# Patient Record
Sex: Female | Born: 1980 | Race: White | Hispanic: No | Smoking: Never smoker
Health system: Southern US, Community
[De-identification: ages and names within clinical notes are randomized; demographics above are authoritative.]

## PROBLEM LIST (undated history)

## (undated) DIAGNOSIS — M722 Plantar fascial fibromatosis: Secondary | ICD-10-CM

## (undated) DIAGNOSIS — G629 Polyneuropathy, unspecified: Secondary | ICD-10-CM

## (undated) HISTORY — PX: FOOT SURGERY: SHX648

---

## 2006-09-30 ENCOUNTER — Ambulatory Visit (HOSPITAL_COMMUNITY): Admission: RE | Admit: 2006-09-30 | Discharge: 2006-09-30 | Payer: Self-pay

## 2006-09-30 ENCOUNTER — Emergency Department (HOSPITAL_COMMUNITY): Admission: EM | Admit: 2006-09-30 | Discharge: 2006-09-30 | Payer: Self-pay | Admitting: Emergency Medicine

## 2006-10-01 ENCOUNTER — Encounter (INDEPENDENT_AMBULATORY_CARE_PROVIDER_SITE_OTHER): Payer: Self-pay | Admitting: *Deleted

## 2006-10-01 ENCOUNTER — Ambulatory Visit (HOSPITAL_COMMUNITY): Admission: AD | Admit: 2006-10-01 | Discharge: 2006-10-02 | Payer: Self-pay | Admitting: *Deleted

## 2006-12-06 ENCOUNTER — Encounter: Admission: RE | Admit: 2006-12-06 | Discharge: 2006-12-06 | Payer: Self-pay | Admitting: Occupational Medicine

## 2010-08-21 ENCOUNTER — Ambulatory Visit
Admission: RE | Admit: 2010-08-21 | Discharge: 2010-08-21 | Disposition: A | Discharge status: Home / Self Care | Payer: BC Managed Care – PPO | Source: Ambulatory Visit | Attending: Family Medicine | Admitting: Family Medicine

## 2010-08-21 ENCOUNTER — Other Ambulatory Visit: Payer: Self-pay | Admitting: Family Medicine

## 2010-08-21 DIAGNOSIS — R52 Pain, unspecified: Secondary | ICD-10-CM

## 2010-08-21 DIAGNOSIS — R609 Edema, unspecified: Secondary | ICD-10-CM

## 2010-11-24 NOTE — H&P (Signed)
NAME:  Sabrina Medina, Sabrina Medina               ACCOUNT NO.:  [id number]   MEDICAL RECORD NO.:  [id number]          PATIENT TYPE:  EMS   LOCATION:  ED                           FACILITY:  [facility]   PHYSICIAN:  Alfonse Ras, MD   DATE OF BIRTH:  [birth year]   DATE OF ADMISSION:  09/30/2006  DATE OF DISCHARGE:                              HISTORY & PHYSICAL   HISTORY OF PRESENT ILLNESS:  The patient is a 30 year old, white female  who presents with a 6-hour history of right lower quadrant abdominal  pain which is actually improved and almost resolved by the time she  presents to the ER.  The patient was ordered a CAT scan which shows an  enlarged appendix with no parous appendiceal stranding or inflammatory  changes.  There is no evidence of fecalith.  She does have a very small,  subcentimeter, hemorrhagic cyst on the left ovary.  She was afebrile at  the [facility] and remains afebrile here.   PAST MEDICAL HISTORY:  None.   PAST SURGICAL HISTORY:  Significant only for a C-section.   MEDICATIONS:  She takes no medications.   PAST GYNECOLOGICAL HISTORY:  Last menstrual period was September 02, 2006, which she started early 1 month ago and thought that these were  menstrual cramps this morning.   PHYSICAL EXAMINATION:  VITAL SIGNS:  Temperature is 98, blood pressure  is 150/72, heart rate is 79, respiratory rate is 20.  HEENT:  Exam is benign.  Normocephalic, atraumatic.  Pupils are equal,  round, reactive to light.  NECK:  Supple and soft without thyromegaly or cervical adenopathy.  LUNGS:  Clear to auscultation and percussion x2.  HEART:  Regular rate and rhythm without murmur, rubs or gallops.  ABDOMEN:  Soft, moderately obese, slightly tender with deep palpation in  the right lower quadrant without rebound.  She has no costovertebral  angle tenderness.  EXTREMITIES:  Show no clubbing, cyanosis or edema.   LABORATORY DATA AND X-RAY FINDINGS:  CT scan shows a slightly  enlarged,  9 mm appendix without any inflammatory changes.   IMPRESSION/PLAN:  Possible early acute appendicitis, however, clinically  markedly improved.  I gave her the option of laparoscopic exploration  versus discharging home and coming back for repeat CT scan in the  morning.  I will discuss this with Dr. Katrinka Blazing.  My plan is to discharge  her for followup CT scan tomorrow.  Hopefully, that report  will be called to me per Dr. Katrinka Blazing when I am in the office in the  morning.  Instructions are given to the patient.  I did not give the  patient any narcotics and I have asked her to return to the emergency  room with worsening pain, nausea or vomiting of which she denies at this  time.      Alfonse Ras, MD  Electronically Signed     KRE/MEDQ  D:  09/30/2006  T:  10/01/2006  Job:  [id number]   cc:   [facility]/Dr. Katrinka Blazing

## 2010-11-24 NOTE — Op Note (Signed)
[name], Medina               ACCOUNT NO.:  [id number]   MEDICAL RECORD NO.:  [id number]          PATIENT TYPE:  OIB   LOCATION:  0098                         FACILITY:  [facility]   PHYSICIAN:  Anselm Pancoast. Weatherly, M.D.DATE OF BIRTH:  [birth year]   DATE OF PROCEDURE:  10/01/2006  DATE OF DISCHARGE:                               OPERATIVE REPORT   PREOPERATIVE DIAGNOSIS:  Acute appendicitis, diabetes mellitus.   POSTOPERATIVE DIAGNOSIS:  Acute appendicitis, diabetes mellitus.   OPERATION:  Laparoscopic appendectomy.   ANESTHESIA:  General.   SURGEON:  Anselm Pancoast. Zachery Dakins, M.D.   ASSISTANT:  Nurse.   HISTORY:  Sabrina Medina is a 30 year old female who presented to the ER  yesterday, was seen last evening after a CAT scan by Dr. Colin Benton.  The  patient states Dr. Colin Benton advised her to go home but to keep in touch.  I  think the appendix had been enlarged and was thought to be an early  appendicitis on the reading of the CT.  The patient returned this  morning and had a follow up CT that showed a more inflamed appendix with  a surrounding streak and Dr. Colin Benton was not able to get her to the OR  schedule today because of scheduling conflicts, and asked me to manage  the patient.  At approximately 4:30, OR time was available and she was  given 3 grams Unasyn and taken to the operating suite.   DESCRIPTION OF PROCEDURE:  The abdomen was prepped with Betadine  solution.  A Foley catheter had been inserted sterilely.  She had PAS  stockings.  A small incision was made below the umbilicus, the fascia  was identified, and then we carefully entered into the peritoneal  cavity.  A purse-string suture of 0 Vicryl was placed and the Hasson  cannula introduced.  The patient did have fluid in the lower right  abdomen and a 5 mm port was placed in the right upper quadrant and a  10/11 in the left lower quadrant, and I could see the acutely inflamed  appendix right under the cecum and could grasp  it with a grasper.  The  mesentery was first divided with the Harmonic scalpel and freed right on  down to the base of the appendix and then the appendix was removed,  transected with a vascular GIA laparoscopic stapler.  The appendix was  acutely inflamed and placed in an endocatch bag.  The bag containing the  appendix was withdrawn.  The Hassan cannula was reinserted, the CO2 tank  was empty at this point and there was about a 5 minute delay as we were  getting a new tank hooked up, but there was no evidence of any bleeding  and the site where the appendix had been removed was good hemostasis and  we looked at the areas where the mesentery had been divided and it was  nice and dry.  Next, the 10/11 port in the left lower quadrant was  removed, this is where I had kept the camera, and I was then using the  camera at the umbilicus.  Next, the 5 mm port was withdrawn under direct  view and then the [facility] cannula was withdrawn.  I put a figure-of-eight  of 0 Vicryl in addition to the purse-  string, tied them both, and anesthetized the fascia.  The subcutaneous  wounds were closed with 4-0 Vicryl, Benzoin and Steri-Strips on the  skin.  We will check a glucose in the recovery room and I will give her  one additional dose of antibiotic and, hopefully, she will be ready for  discharge in the a.m.           ______________________________  Anselm Pancoast. Zachery Dakins, M.D.     WJW/MEDQ  D:  10/01/2006  T:  10/01/2006  Job:  [id number]

## 2011-06-16 ENCOUNTER — Encounter: Payer: Self-pay | Admitting: Nurse Practitioner

## 2011-06-16 ENCOUNTER — Emergency Department (HOSPITAL_COMMUNITY)
Admission: EM | Admit: 2011-06-16 | Discharge: 2011-06-16 | Disposition: A | Discharge status: Home / Self Care | Payer: No Typology Code available for payment source | Attending: Emergency Medicine | Admitting: Emergency Medicine

## 2011-06-16 ENCOUNTER — Emergency Department (HOSPITAL_COMMUNITY): Payer: No Typology Code available for payment source

## 2011-06-16 DIAGNOSIS — S8002XA Contusion of left knee, initial encounter: Secondary | ICD-10-CM

## 2011-06-16 DIAGNOSIS — E119 Type 2 diabetes mellitus without complications: Secondary | ICD-10-CM | POA: Insufficient documentation

## 2011-06-16 DIAGNOSIS — M25569 Pain in unspecified knee: Secondary | ICD-10-CM | POA: Insufficient documentation

## 2011-06-16 DIAGNOSIS — S8000XA Contusion of unspecified knee, initial encounter: Secondary | ICD-10-CM | POA: Insufficient documentation

## 2011-06-16 DIAGNOSIS — T148XXA Other injury of unspecified body region, initial encounter: Secondary | ICD-10-CM

## 2011-06-16 HISTORY — DX: Polyneuropathy, unspecified: G62.9

## 2011-06-16 HISTORY — DX: Plantar fascial fibromatosis: M72.2

## 2011-06-16 MED ORDER — ACETAMINOPHEN 325 MG PO TABS
650.0000 mg | ORAL_TABLET | Freq: Once | ORAL | Status: AC
  Administered 2011-06-16: 650 mg via ORAL
  Filled 2011-06-16: qty 2

## 2011-06-16 MED ORDER — IBUPROFEN 600 MG PO TABS: 600.0000 mg | ORAL_TABLET | Freq: Four times a day (QID) | ORAL | Status: AC | PRN

## 2011-06-16 MED ORDER — HYDROCODONE-ACETAMINOPHEN 5-500 MG PO TABS: 1.0000 | ORAL_TABLET | Freq: Four times a day (QID) | ORAL | Status: AC | PRN

## 2011-06-16 MED ORDER — ACETAMINOPHEN 325 MG PO TABS
ORAL_TABLET | ORAL | Status: AC
  Filled 2011-06-16: qty 1

## 2011-06-16 MED ORDER — IBUPROFEN 200 MG PO TABS
600.0000 mg | ORAL_TABLET | Freq: Once | ORAL | Status: AC
  Administered 2011-06-16: 600 mg via ORAL
  Filled 2011-06-16: qty 3

## 2011-06-16 MED ORDER — HYDROCODONE-ACETAMINOPHEN 5-325 MG PO TABS
1.0000 | ORAL_TABLET | Freq: Once | ORAL | Status: AC
  Administered 2011-06-16: 1 via ORAL
  Filled 2011-06-16: qty 1
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
## 2011-07-19 ENCOUNTER — Ambulatory Visit: Payer: BC Managed Care – PPO | Attending: Family Medicine | Admitting: Physical Therapy

## 2011-07-19 DIAGNOSIS — M542 Cervicalgia: Secondary | ICD-10-CM | POA: Insufficient documentation

## 2011-07-19 DIAGNOSIS — IMO0001 Reserved for inherently not codable concepts without codable children: Secondary | ICD-10-CM | POA: Insufficient documentation

## 2011-07-24 ENCOUNTER — Ambulatory Visit: Payer: BC Managed Care – PPO | Admitting: Physical Therapy

## 2011-07-27 ENCOUNTER — Ambulatory Visit: Payer: BC Managed Care – PPO | Admitting: Physical Therapy

## 2011-07-31 ENCOUNTER — Ambulatory Visit: Payer: BC Managed Care – PPO | Admitting: Physical Therapy

## 2011-08-02 ENCOUNTER — Encounter: Payer: Self-pay | Admitting: Physical Therapy

## 2011-08-03 ENCOUNTER — Encounter: Payer: Self-pay | Admitting: Physical Therapy

## 2011-08-07 ENCOUNTER — Ambulatory Visit: Payer: BC Managed Care – PPO | Admitting: Physical Therapy

## 2011-08-09 ENCOUNTER — Encounter: Payer: Self-pay | Admitting: Physical Therapy

## 2011-08-14 ENCOUNTER — Encounter: Payer: Self-pay | Admitting: Physical Therapy

## 2011-08-16 ENCOUNTER — Encounter: Payer: Self-pay | Admitting: Physical Therapy

## 2011-08-21 ENCOUNTER — Encounter: Payer: Self-pay | Admitting: Physical Therapy

## 2011-08-23 ENCOUNTER — Encounter: Payer: Self-pay | Admitting: Physical Therapy

## 2012-12-19 ENCOUNTER — Ambulatory Visit
Admission: RE | Admit: 2012-12-19 | Discharge: 2012-12-19 | Disposition: A | Discharge status: Home / Self Care | Payer: Managed Care, Other (non HMO) | Source: Ambulatory Visit | Attending: Family Medicine | Admitting: Family Medicine

## 2012-12-19 ENCOUNTER — Other Ambulatory Visit: Payer: Self-pay | Admitting: Family Medicine

## 2012-12-19 DIAGNOSIS — R109 Unspecified abdominal pain: Secondary | ICD-10-CM
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
## 2014-12-07 ENCOUNTER — Ambulatory Visit: Payer: Self-pay | Admitting: *Deleted

## 2014-12-30 ENCOUNTER — Other Ambulatory Visit: Payer: Self-pay | Admitting: Advanced Practice Midwife

## 2015-10-17 DIAGNOSIS — Z794 Long term (current) use of insulin: Secondary | ICD-10-CM | POA: Diagnosis not present

## 2015-10-17 DIAGNOSIS — E103399 Type 1 diabetes mellitus with moderate nonproliferative diabetic retinopathy without macular edema, unspecified eye: Secondary | ICD-10-CM | POA: Diagnosis not present

## 2015-11-01 DIAGNOSIS — E103393 Type 1 diabetes mellitus with moderate nonproliferative diabetic retinopathy without macular edema, bilateral: Secondary | ICD-10-CM | POA: Diagnosis not present

## 2015-11-02 DIAGNOSIS — Z794 Long term (current) use of insulin: Secondary | ICD-10-CM | POA: Diagnosis not present

## 2015-11-02 DIAGNOSIS — E109 Type 1 diabetes mellitus without complications: Secondary | ICD-10-CM | POA: Diagnosis not present

## 2015-12-06 DIAGNOSIS — Z794 Long term (current) use of insulin: Secondary | ICD-10-CM | POA: Diagnosis not present

## 2015-12-06 DIAGNOSIS — E109 Type 1 diabetes mellitus without complications: Secondary | ICD-10-CM | POA: Diagnosis not present

## 2016-01-02 DIAGNOSIS — E10319 Type 1 diabetes mellitus with unspecified diabetic retinopathy without macular edema: Secondary | ICD-10-CM | POA: Diagnosis not present

## 2016-01-02 DIAGNOSIS — E103393 Type 1 diabetes mellitus with moderate nonproliferative diabetic retinopathy without macular edema, bilateral: Secondary | ICD-10-CM | POA: Diagnosis not present

## 2016-01-05 DIAGNOSIS — E109 Type 1 diabetes mellitus without complications: Secondary | ICD-10-CM | POA: Diagnosis not present

## 2016-01-05 DIAGNOSIS — Z794 Long term (current) use of insulin: Secondary | ICD-10-CM | POA: Diagnosis not present

## 2016-02-03 DIAGNOSIS — E109 Type 1 diabetes mellitus without complications: Secondary | ICD-10-CM | POA: Diagnosis not present

## 2016-02-03 DIAGNOSIS — Z794 Long term (current) use of insulin: Secondary | ICD-10-CM | POA: Diagnosis not present

## 2016-02-24 DIAGNOSIS — E109 Type 1 diabetes mellitus without complications: Secondary | ICD-10-CM | POA: Diagnosis not present

## 2016-02-24 DIAGNOSIS — Z794 Long term (current) use of insulin: Secondary | ICD-10-CM | POA: Diagnosis not present

## 2016-03-13 DIAGNOSIS — Z8619 Personal history of other infectious and parasitic diseases: Secondary | ICD-10-CM | POA: Diagnosis not present

## 2016-03-27 DIAGNOSIS — R5383 Other fatigue: Secondary | ICD-10-CM | POA: Diagnosis not present

## 2016-03-27 DIAGNOSIS — N943 Premenstrual tension syndrome: Secondary | ICD-10-CM | POA: Diagnosis not present

## 2016-03-30 DIAGNOSIS — E109 Type 1 diabetes mellitus without complications: Secondary | ICD-10-CM | POA: Diagnosis not present

## 2016-03-30 DIAGNOSIS — Z794 Long term (current) use of insulin: Secondary | ICD-10-CM | POA: Diagnosis not present

## 2016-04-17 DIAGNOSIS — Z794 Long term (current) use of insulin: Secondary | ICD-10-CM | POA: Diagnosis not present

## 2016-04-17 DIAGNOSIS — E103393 Type 1 diabetes mellitus with moderate nonproliferative diabetic retinopathy without macular edema, bilateral: Secondary | ICD-10-CM | POA: Diagnosis not present

## 2016-04-26 DIAGNOSIS — Z794 Long term (current) use of insulin: Secondary | ICD-10-CM | POA: Diagnosis not present

## 2016-04-26 DIAGNOSIS — E103392 Type 1 diabetes mellitus with moderate nonproliferative diabetic retinopathy without macular edema, left eye: Secondary | ICD-10-CM | POA: Diagnosis not present

## 2016-04-27 DIAGNOSIS — E109 Type 1 diabetes mellitus without complications: Secondary | ICD-10-CM | POA: Diagnosis not present

## 2016-04-27 DIAGNOSIS — Z794 Long term (current) use of insulin: Secondary | ICD-10-CM | POA: Diagnosis not present

## 2016-05-01 DIAGNOSIS — H1045 Other chronic allergic conjunctivitis: Secondary | ICD-10-CM | POA: Diagnosis not present

## 2016-05-22 DIAGNOSIS — E109 Type 1 diabetes mellitus without complications: Secondary | ICD-10-CM | POA: Diagnosis not present

## 2016-05-22 DIAGNOSIS — Z794 Long term (current) use of insulin: Secondary | ICD-10-CM | POA: Diagnosis not present

## 2016-06-26 DIAGNOSIS — E109 Type 1 diabetes mellitus without complications: Secondary | ICD-10-CM | POA: Diagnosis not present

## 2016-06-26 DIAGNOSIS — Z794 Long term (current) use of insulin: Secondary | ICD-10-CM | POA: Diagnosis not present

## 2016-07-08 DIAGNOSIS — J101 Influenza due to other identified influenza virus with other respiratory manifestations: Secondary | ICD-10-CM | POA: Diagnosis not present

## 2016-07-30 DIAGNOSIS — Z794 Long term (current) use of insulin: Secondary | ICD-10-CM | POA: Diagnosis not present

## 2016-07-30 DIAGNOSIS — E109 Type 1 diabetes mellitus without complications: Secondary | ICD-10-CM | POA: Diagnosis not present

## 2016-08-23 DIAGNOSIS — E103313 Type 1 diabetes mellitus with moderate nonproliferative diabetic retinopathy with macular edema, bilateral: Secondary | ICD-10-CM | POA: Diagnosis not present

## 2016-08-28 DIAGNOSIS — E109 Type 1 diabetes mellitus without complications: Secondary | ICD-10-CM | POA: Diagnosis not present

## 2016-08-28 DIAGNOSIS — Z794 Long term (current) use of insulin: Secondary | ICD-10-CM | POA: Diagnosis not present

## 2016-09-04 DIAGNOSIS — Z794 Long term (current) use of insulin: Secondary | ICD-10-CM | POA: Diagnosis not present

## 2016-09-04 DIAGNOSIS — E1165 Type 2 diabetes mellitus with hyperglycemia: Secondary | ICD-10-CM | POA: Diagnosis not present

## 2016-09-04 DIAGNOSIS — E103399 Type 1 diabetes mellitus with moderate nonproliferative diabetic retinopathy without macular edema, unspecified eye: Secondary | ICD-10-CM | POA: Diagnosis not present

## 2016-09-27 DIAGNOSIS — E109 Type 1 diabetes mellitus without complications: Secondary | ICD-10-CM | POA: Diagnosis not present

## 2016-09-27 DIAGNOSIS — H10413 Chronic giant papillary conjunctivitis, bilateral: Secondary | ICD-10-CM | POA: Diagnosis not present

## 2016-09-27 DIAGNOSIS — Z794 Long term (current) use of insulin: Secondary | ICD-10-CM | POA: Diagnosis not present

## 2016-10-23 DIAGNOSIS — Z794 Long term (current) use of insulin: Secondary | ICD-10-CM | POA: Diagnosis not present

## 2016-10-23 DIAGNOSIS — E109 Type 1 diabetes mellitus without complications: Secondary | ICD-10-CM | POA: Diagnosis not present

## 2016-11-23 DIAGNOSIS — Z794 Long term (current) use of insulin: Secondary | ICD-10-CM | POA: Diagnosis not present

## 2016-11-23 DIAGNOSIS — E109 Type 1 diabetes mellitus without complications: Secondary | ICD-10-CM | POA: Diagnosis not present

## 2016-12-11 DIAGNOSIS — Z01419 Encounter for gynecological examination (general) (routine) without abnormal findings: Secondary | ICD-10-CM | POA: Diagnosis not present

## 2016-12-11 DIAGNOSIS — Z30431 Encounter for routine checking of intrauterine contraceptive device: Secondary | ICD-10-CM | POA: Diagnosis not present

## 2016-12-11 DIAGNOSIS — Z6825 Body mass index (BMI) 25.0-25.9, adult: Secondary | ICD-10-CM | POA: Diagnosis not present

## 2016-12-24 DIAGNOSIS — E109 Type 1 diabetes mellitus without complications: Secondary | ICD-10-CM | POA: Diagnosis not present

## 2016-12-24 DIAGNOSIS — Z794 Long term (current) use of insulin: Secondary | ICD-10-CM | POA: Diagnosis not present

## 2017-01-16 DIAGNOSIS — E109 Type 1 diabetes mellitus without complications: Secondary | ICD-10-CM | POA: Diagnosis not present

## 2017-01-16 DIAGNOSIS — Z794 Long term (current) use of insulin: Secondary | ICD-10-CM | POA: Diagnosis not present

## 2017-01-18 DIAGNOSIS — Z794 Long term (current) use of insulin: Secondary | ICD-10-CM | POA: Diagnosis not present

## 2017-01-18 DIAGNOSIS — E103399 Type 1 diabetes mellitus with moderate nonproliferative diabetic retinopathy without macular edema, unspecified eye: Secondary | ICD-10-CM | POA: Diagnosis not present

## 2017-01-18 DIAGNOSIS — E1165 Type 2 diabetes mellitus with hyperglycemia: Secondary | ICD-10-CM | POA: Diagnosis not present

## 2017-01-18 DIAGNOSIS — J069 Acute upper respiratory infection, unspecified: Secondary | ICD-10-CM | POA: Diagnosis not present

## 2017-02-12 DIAGNOSIS — E109 Type 1 diabetes mellitus without complications: Secondary | ICD-10-CM | POA: Diagnosis not present

## 2017-02-12 DIAGNOSIS — Z794 Long term (current) use of insulin: Secondary | ICD-10-CM | POA: Diagnosis not present

## 2017-03-13 DIAGNOSIS — E109 Type 1 diabetes mellitus without complications: Secondary | ICD-10-CM | POA: Diagnosis not present

## 2017-03-13 DIAGNOSIS — Z794 Long term (current) use of insulin: Secondary | ICD-10-CM | POA: Diagnosis not present

## 2017-03-20 DIAGNOSIS — J069 Acute upper respiratory infection, unspecified: Secondary | ICD-10-CM | POA: Diagnosis not present

## 2017-04-09 DIAGNOSIS — Z794 Long term (current) use of insulin: Secondary | ICD-10-CM | POA: Diagnosis not present

## 2017-04-09 DIAGNOSIS — E109 Type 1 diabetes mellitus without complications: Secondary | ICD-10-CM | POA: Diagnosis not present

## 2017-05-07 ENCOUNTER — Encounter: Payer: BLUE CROSS/BLUE SHIELD | Attending: Internal Medicine | Admitting: *Deleted

## 2017-05-07 DIAGNOSIS — Z794 Long term (current) use of insulin: Secondary | ICD-10-CM | POA: Diagnosis not present

## 2017-05-07 DIAGNOSIS — E1065 Type 1 diabetes mellitus with hyperglycemia: Secondary | ICD-10-CM | POA: Diagnosis not present

## 2017-05-07 DIAGNOSIS — E109 Type 1 diabetes mellitus without complications: Secondary | ICD-10-CM

## 2017-05-08 NOTE — Patient Instructions (Signed)
Plan: You shared with me your current eating habits along with calorie and carbohydrate goals.  You are also exercising an hour every day with cardio mixed with strength training for building of muscle mass You are happy with the Keto diet and the Intermittent Fasting but would like to know more about the nutritional adequacy. We will follow up in 4 weeks so I can provide you with the information you need, after I do some more research on this topic.

## 2017-05-08 NOTE — Progress Notes (Signed)
Diabetes Self-Management Education  Visit Type: First/Initial  Appt. Start Time: 1545 Appt. End Time: 1645  05/08/2017  [name], identified by name and date of birth, is a 36 y.o. female with a diagnosis of Diabetes: Type 1. She states she manages her DM 1 with average A1c of 6.0%. She has been overweight in the past and has maintained a rigid eating and exercise schedule to control her diabetes and reach a healthy weight. She states she typically was limiting her calories to around 900 / day but recently has increased to 1350 / day with resulting weight gain of about 10 pounds. She notes that this weight gain has been primarily in her thighs with increased strength as well. But she does not want to continue with this weight gain and would like guidance regarding her meal plan. She states her boyfriend is very supportive in her life. She also states she wears the Omni Pod insulin pump and loves it. She is not using any sensor product currently, she is not sure where she would put it.   ASSESSMENT  There were no vitals taken for this visit. Patient states current weight is 160 lbs. There is no height or weight on file to calculate BMI.      Diabetes Self-Management Education - 05/07/17 1546      Visit Information   Visit Type First/Initial     Initial Visit   Diabetes Type Type 1   Are you taking your medications as prescribed? Yes   Date Diagnosed 1989     Health Coping   How would you rate your overall health? Good     Psychosocial Assessment   Patient Belief/Attitude about Diabetes Motivated to manage diabetes   Self-care barriers None   Self-management support Family;Doctor's office   Other persons present Patient   Patient Concerns Nutrition/Meal planning;Glycemic Control;Weight Control   Special Needs None   Learning Readiness Change in progress   How often do you need to have someone help you when you read instructions, pamphlets, or other written materials from  your doctor or pharmacy? 1 - Never   What is the last grade level you completed in school? Bachelors     Pre-Education Assessment   Patient understands the diabetes disease and treatment process. Demonstrates understanding / competency   Patient understands incorporating nutritional management into lifestyle. Needs Review   Patient undertands incorporating physical activity into lifestyle. Demonstrates understanding / competency   Patient understands using medications safely. Demonstrates understanding / competency   Patient understands monitoring blood glucose, interpreting and using results Demonstrates understanding / competency   Patient understands prevention, detection, and treatment of acute complications. Demonstrates understanding / competency   Patient understands prevention, detection, and treatment of chronic complications. Demonstrates understanding / competency   Patient understands how to develop strategies to address psychosocial issues. Demonstrates understanding / competency   Patient understands how to develop strategies to promote health/change behavior. Demonstrates understanding / competency     Complications   Last HgB A1C per patient/outside source 6 %   How often do you check your blood sugar? > 4 times/day  8-10   Fasting Blood glucose range (mg/dL) [phone]   Postprandial Blood glucose range (mg/dL) [phone]   Number of hypoglycemic episodes per month 12   Can you tell when your blood sugar is low? Yes   Have you had a dilated eye exam in the past 12 months? Yes   Have you had a dental exam in the past  12 months? Yes   Are you checking your feet? Yes   How many days per week are you checking your feet? 7     Dietary Intake   Breakfast sola bread, whole avacado, 2 eggs OR salad with sour cream dip and spices with romaine lettuce OR protein bars by [employer]    Snack (morning) not usually   Lunch 12-2 PM: salad with sour cream OR [employer] OR grilled seafood    Dinner skips usually   Beverage(s) coffee, propel water, caffeine boost before a workout, glass red wine, pinot noir, tequila without any mixers most days,      Exercise   How many days per week to you exercise? 7   How many minutes per day do you exercise? 60   Total minutes per week of exercise 420     Patient Education   Previous Diabetes Education Yes (please comment)   Nutrition management  Other (comment)  patient follows intermittent fasting and keto diet. Wants update on nutritional adequacy     Individualized Goals (developed by patient)   Nutrition Other (comment)  I plan to research long term effects of keto diet and resume visit in 4 weeks.     Outcomes   Expected Outcomes Demonstrated interest in learning. Expect positive outcomes   Future DMSE 4-6 wks   Program Status Not Completed      Individualized Plan for Diabetes Self-Management Training:   Learning Objective:  Patient will have a greater understanding of diabetes self-management. Patient education plan is to attend individual and/or group sessions per assessed needs and concerns.   Plan:   Patient Instructions  Plan: You shared with me your current eating habits along with calorie and carbohydrate goals.  You are also exercising an hour every day with cardio mixed with strength training for building of muscle mass You are happy with the Keto diet and the Intermittent Fasting but would like to know more about the nutritional adequacy. We will follow up in 4 weeks so I can provide you with the information you need, after I do some more research on this topic.   Expected Outcomes:  Demonstrated interest in learning. Expect positive outcomes  Education material provided: No new handouts today  If problems or questions, patient to contact team via:  Phone  Future DSME appointment: 4-6 wks

## 2017-05-15 DIAGNOSIS — E109 Type 1 diabetes mellitus without complications: Secondary | ICD-10-CM | POA: Diagnosis not present

## 2017-05-15 DIAGNOSIS — Z794 Long term (current) use of insulin: Secondary | ICD-10-CM | POA: Diagnosis not present

## 2017-05-21 ENCOUNTER — Other Ambulatory Visit: Payer: Self-pay | Admitting: Internal Medicine

## 2017-05-21 DIAGNOSIS — E103399 Type 1 diabetes mellitus with moderate nonproliferative diabetic retinopathy without macular edema, unspecified eye: Secondary | ICD-10-CM

## 2017-05-22 ENCOUNTER — Other Ambulatory Visit: Payer: Self-pay

## 2017-05-24 ENCOUNTER — Ambulatory Visit
Admission: RE | Admit: 2017-05-24 | Discharge: 2017-05-24 | Disposition: A | Discharge status: Home / Self Care | Payer: BLUE CROSS/BLUE SHIELD | Source: Ambulatory Visit | Attending: Internal Medicine | Admitting: Internal Medicine

## 2017-05-24 DIAGNOSIS — I6523 Occlusion and stenosis of bilateral carotid arteries: Secondary | ICD-10-CM | POA: Diagnosis not present

## 2017-05-24 DIAGNOSIS — E103399 Type 1 diabetes mellitus with moderate nonproliferative diabetic retinopathy without macular edema, unspecified eye: Secondary | ICD-10-CM

## 2017-06-05 ENCOUNTER — Encounter: Payer: BLUE CROSS/BLUE SHIELD | Attending: Internal Medicine | Admitting: *Deleted

## 2017-06-05 DIAGNOSIS — E109 Type 1 diabetes mellitus without complications: Secondary | ICD-10-CM

## 2017-06-05 DIAGNOSIS — Z794 Long term (current) use of insulin: Secondary | ICD-10-CM | POA: Insufficient documentation

## 2017-06-05 DIAGNOSIS — E1065 Type 1 diabetes mellitus with hyperglycemia: Secondary | ICD-10-CM | POA: Insufficient documentation

## 2017-06-28 NOTE — Progress Notes (Signed)
Diabetes Self-Management Education  Visit Type:    Appt. Start Time: 1530 Appt. End Time: 1630  06/28/2017  Sabrina Medina, identified by name and date of birth, is a 36 y.o. female with a diagnosis of Diabetes:    She is here for follow up visit to discuss further the nutritional adequacy of the modified keto genic diet she has been following for the past 3 years. She acknowledges the information she provided at her first visit: She states she manages her DM 1 with average A1c of 6.0%. She has been overweight in the past and has maintained a rigid eating and exercise schedule to control her diabetes and reach a healthy weight. She states she typically was limiting her calories to around 900 / day but recently has increased to 1350 / day with resulting weight gain of about 10 pounds. She notes that this weight gain has been primarily in her thighs with increased strength as well. But she does not want to continue with this weight gain and would like guidance regarding her meal plan. She states her boyfriend is very supportive in her life. She also states she wears the Omni Pod insulin pump and loves it. She is not using any sensor product currently, she is not sure where she would put it.   ASSESSMENT  There were no vitals taken for this visit. Patient states current weight is 160 lbs. There is no height or weight on file to calculate BMI.  Individualized Plan for Diabetes Self-Management Training:   Learning Objective:  Patient will have a greater understanding of diabetes self-management. Patient education plan is to attend individual and/or group sessions per assessed needs and concerns.   I provided her with several research articles on this subject and there is growing support for the benefits of limiting carbohydrates to less than the current recommendation of at least 130 grams a day in the cases of Type 1 diabetes with improved control. This support is for relatively short periods of  time, such as over a year, so long term nutritional adequacy is not yet determined.  Plan:   Patient Instructions  Plan: You shared with me your current eating habits along with calorie and carbohydrate goals.  You are also exercising an hour every day with cardio mixed with strength training for building of muscle mass You also state you are taking a multivitamin daily You are happy with the Keto diet and the Intermittent Fasting and feel no social pressure to change or eat differently   Expected Outcomes:     Education material provided: No new handouts today  If problems or questions, patient to contact team via:  Phone  Future DSME appointment:   PRN

## 2017-07-07 DIAGNOSIS — Z794 Long term (current) use of insulin: Secondary | ICD-10-CM | POA: Diagnosis not present

## 2017-07-07 DIAGNOSIS — E109 Type 1 diabetes mellitus without complications: Secondary | ICD-10-CM | POA: Diagnosis not present

## 2017-07-08 NOTE — Patient Instructions (Signed)
  Plan: You shared with me your current eating habits along with calorie and carbohydrate goals.  You are also exercising an hour every day with cardio mixed with strength training for building of muscle mass You also state you are taking a multivitamin daily You are happy with the Keto diet and the Intermittent Fasting and feel no social pressure to change or eat differently

## 2017-08-15 DIAGNOSIS — Z794 Long term (current) use of insulin: Secondary | ICD-10-CM | POA: Diagnosis not present

## 2017-08-15 DIAGNOSIS — E109 Type 1 diabetes mellitus without complications: Secondary | ICD-10-CM | POA: Diagnosis not present

## 2017-08-18 DIAGNOSIS — R05 Cough: Secondary | ICD-10-CM | POA: Diagnosis not present

## 2017-08-18 DIAGNOSIS — J029 Acute pharyngitis, unspecified: Secondary | ICD-10-CM | POA: Diagnosis not present

## 2017-09-12 DIAGNOSIS — Z794 Long term (current) use of insulin: Secondary | ICD-10-CM | POA: Diagnosis not present

## 2017-09-12 DIAGNOSIS — E1165 Type 2 diabetes mellitus with hyperglycemia: Secondary | ICD-10-CM | POA: Diagnosis not present

## 2017-09-12 DIAGNOSIS — E103399 Type 1 diabetes mellitus with moderate nonproliferative diabetic retinopathy without macular edema, unspecified eye: Secondary | ICD-10-CM | POA: Diagnosis not present

## 2017-09-12 DIAGNOSIS — E109 Type 1 diabetes mellitus without complications: Secondary | ICD-10-CM | POA: Diagnosis not present

## 2017-09-15 DIAGNOSIS — Z794 Long term (current) use of insulin: Secondary | ICD-10-CM | POA: Diagnosis not present

## 2017-09-15 DIAGNOSIS — E103399 Type 1 diabetes mellitus with moderate nonproliferative diabetic retinopathy without macular edema, unspecified eye: Secondary | ICD-10-CM | POA: Diagnosis not present

## 2017-10-11 DIAGNOSIS — E109 Type 1 diabetes mellitus without complications: Secondary | ICD-10-CM | POA: Diagnosis not present

## 2017-10-11 DIAGNOSIS — Z794 Long term (current) use of insulin: Secondary | ICD-10-CM | POA: Diagnosis not present

## 2017-11-13 DIAGNOSIS — Z794 Long term (current) use of insulin: Secondary | ICD-10-CM | POA: Diagnosis not present

## 2017-11-13 DIAGNOSIS — E109 Type 1 diabetes mellitus without complications: Secondary | ICD-10-CM | POA: Diagnosis not present

## 2017-11-21 DIAGNOSIS — H5213 Myopia, bilateral: Secondary | ICD-10-CM | POA: Diagnosis not present

## 2017-12-15 DIAGNOSIS — E109 Type 1 diabetes mellitus without complications: Secondary | ICD-10-CM | POA: Diagnosis not present

## 2017-12-15 DIAGNOSIS — Z794 Long term (current) use of insulin: Secondary | ICD-10-CM | POA: Diagnosis not present

## 2018-01-13 DIAGNOSIS — Z794 Long term (current) use of insulin: Secondary | ICD-10-CM | POA: Diagnosis not present

## 2018-01-13 DIAGNOSIS — E109 Type 1 diabetes mellitus without complications: Secondary | ICD-10-CM | POA: Diagnosis not present

## 2018-02-13 DIAGNOSIS — E109 Type 1 diabetes mellitus without complications: Secondary | ICD-10-CM | POA: Diagnosis not present

## 2018-02-13 DIAGNOSIS — Z794 Long term (current) use of insulin: Secondary | ICD-10-CM | POA: Diagnosis not present

## 2018-03-17 DIAGNOSIS — Z794 Long term (current) use of insulin: Secondary | ICD-10-CM | POA: Diagnosis not present

## 2018-03-17 DIAGNOSIS — E109 Type 1 diabetes mellitus without complications: Secondary | ICD-10-CM | POA: Diagnosis not present

## 2018-04-18 DIAGNOSIS — Z794 Long term (current) use of insulin: Secondary | ICD-10-CM | POA: Diagnosis not present

## 2018-04-18 DIAGNOSIS — E109 Type 1 diabetes mellitus without complications: Secondary | ICD-10-CM | POA: Diagnosis not present

## 2018-04-29 DIAGNOSIS — E103293 Type 1 diabetes mellitus with mild nonproliferative diabetic retinopathy without macular edema, bilateral: Secondary | ICD-10-CM | POA: Diagnosis not present

## 2018-04-29 DIAGNOSIS — Z794 Long term (current) use of insulin: Secondary | ICD-10-CM | POA: Diagnosis not present

## 2018-05-06 DIAGNOSIS — F321 Major depressive disorder, single episode, moderate: Secondary | ICD-10-CM | POA: Diagnosis not present

## 2018-05-20 DIAGNOSIS — Z794 Long term (current) use of insulin: Secondary | ICD-10-CM | POA: Diagnosis not present

## 2018-05-20 DIAGNOSIS — E109 Type 1 diabetes mellitus without complications: Secondary | ICD-10-CM | POA: Diagnosis not present

## 2018-06-03 DIAGNOSIS — F321 Major depressive disorder, single episode, moderate: Secondary | ICD-10-CM | POA: Diagnosis not present

## 2018-06-11 DIAGNOSIS — Z6824 Body mass index (BMI) 24.0-24.9, adult: Secondary | ICD-10-CM | POA: Diagnosis not present

## 2018-06-11 DIAGNOSIS — Z1151 Encounter for screening for human papillomavirus (HPV): Secondary | ICD-10-CM | POA: Diagnosis not present

## 2018-06-11 DIAGNOSIS — Z01419 Encounter for gynecological examination (general) (routine) without abnormal findings: Secondary | ICD-10-CM | POA: Diagnosis not present

## 2018-06-24 DIAGNOSIS — E109 Type 1 diabetes mellitus without complications: Secondary | ICD-10-CM | POA: Diagnosis not present

## 2018-06-24 DIAGNOSIS — Z794 Long term (current) use of insulin: Secondary | ICD-10-CM | POA: Diagnosis not present

## 2018-07-21 DIAGNOSIS — Z794 Long term (current) use of insulin: Secondary | ICD-10-CM | POA: Diagnosis not present

## 2018-07-21 DIAGNOSIS — E109 Type 1 diabetes mellitus without complications: Secondary | ICD-10-CM | POA: Diagnosis not present

## 2018-08-04 DIAGNOSIS — F321 Major depressive disorder, single episode, moderate: Secondary | ICD-10-CM | POA: Diagnosis not present

## 2018-08-26 DIAGNOSIS — Z794 Long term (current) use of insulin: Secondary | ICD-10-CM | POA: Diagnosis not present

## 2018-08-26 DIAGNOSIS — E109 Type 1 diabetes mellitus without complications: Secondary | ICD-10-CM | POA: Diagnosis not present

## 2018-09-03 DIAGNOSIS — H10412 Chronic giant papillary conjunctivitis, left eye: Secondary | ICD-10-CM | POA: Diagnosis not present

## 2018-09-24 DIAGNOSIS — E109 Type 1 diabetes mellitus without complications: Secondary | ICD-10-CM | POA: Diagnosis not present

## 2018-09-24 DIAGNOSIS — Z794 Long term (current) use of insulin: Secondary | ICD-10-CM | POA: Diagnosis not present

## 2018-10-27 DIAGNOSIS — E109 Type 1 diabetes mellitus without complications: Secondary | ICD-10-CM | POA: Diagnosis not present

## 2018-10-27 DIAGNOSIS — Z794 Long term (current) use of insulin: Secondary | ICD-10-CM | POA: Diagnosis not present

## 2018-11-06 DIAGNOSIS — F321 Major depressive disorder, single episode, moderate: Secondary | ICD-10-CM | POA: Diagnosis not present

## 2018-11-06 DIAGNOSIS — G479 Sleep disorder, unspecified: Secondary | ICD-10-CM | POA: Diagnosis not present

## 2018-11-20 DIAGNOSIS — E103293 Type 1 diabetes mellitus with mild nonproliferative diabetic retinopathy without macular edema, bilateral: Secondary | ICD-10-CM | POA: Diagnosis not present

## 2018-11-25 DIAGNOSIS — E103293 Type 1 diabetes mellitus with mild nonproliferative diabetic retinopathy without macular edema, bilateral: Secondary | ICD-10-CM | POA: Diagnosis not present

## 2018-11-26 DIAGNOSIS — Z794 Long term (current) use of insulin: Secondary | ICD-10-CM | POA: Diagnosis not present

## 2018-11-26 DIAGNOSIS — E109 Type 1 diabetes mellitus without complications: Secondary | ICD-10-CM | POA: Diagnosis not present

## 2018-12-16 DIAGNOSIS — E109 Type 1 diabetes mellitus without complications: Secondary | ICD-10-CM | POA: Diagnosis not present

## 2018-12-16 DIAGNOSIS — Z794 Long term (current) use of insulin: Secondary | ICD-10-CM | POA: Diagnosis not present

## 2018-12-29 DIAGNOSIS — E109 Type 1 diabetes mellitus without complications: Secondary | ICD-10-CM | POA: Diagnosis not present

## 2018-12-29 DIAGNOSIS — Z794 Long term (current) use of insulin: Secondary | ICD-10-CM | POA: Diagnosis not present

## 2019-01-12 DIAGNOSIS — Z794 Long term (current) use of insulin: Secondary | ICD-10-CM | POA: Diagnosis not present

## 2019-01-12 DIAGNOSIS — E103293 Type 1 diabetes mellitus with mild nonproliferative diabetic retinopathy without macular edema, bilateral: Secondary | ICD-10-CM | POA: Diagnosis not present

## 2019-01-25 IMAGING — US US CAROTID DUPLEX BILAT
1 series · 13 of 24 positions shown · non-contrast
Comparison: None.

CLINICAL DATA: Moderate non proliferative diabetic retinopathy.

EXAM:
BILATERAL CAROTID DUPLEX ULTRASOUND
TECHNIQUE: Gray scale imaging, color Doppler and duplex ultrasound were
performed of bilateral carotid and vertebral arteries in the neck.

[Series 1: us carotid duplex bilat · 0.06mm/px · 13 of 70 slices shown]
[im 1/70]
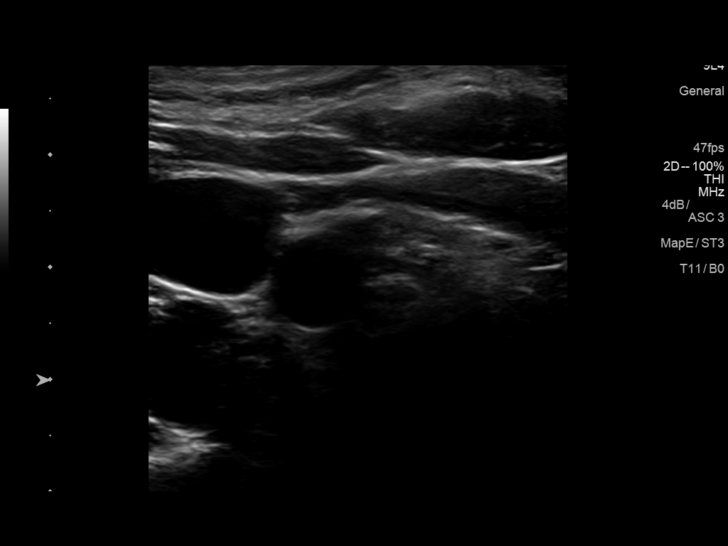
[im 7/70]
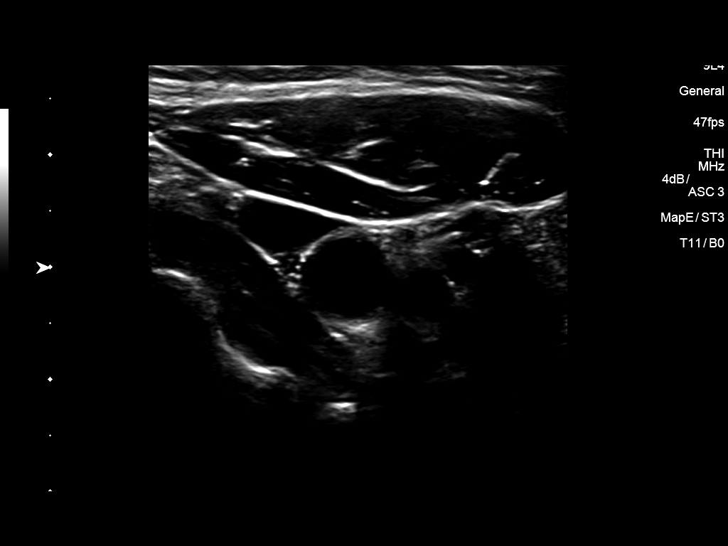
[im 13/70]
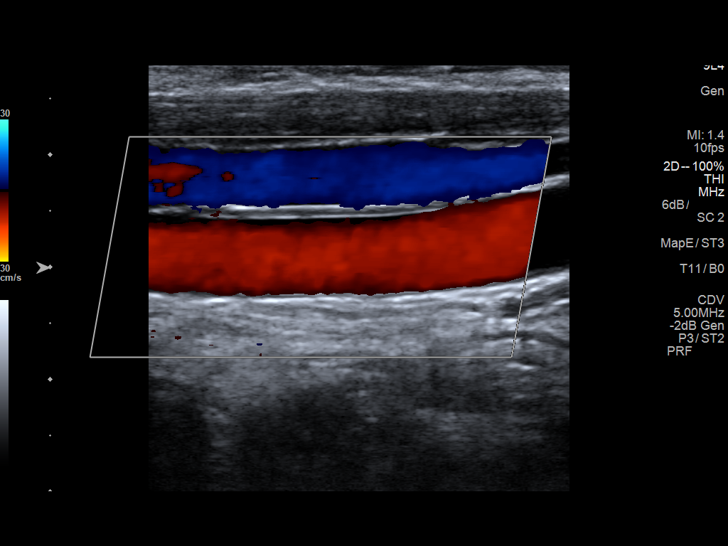
[im 19/70]
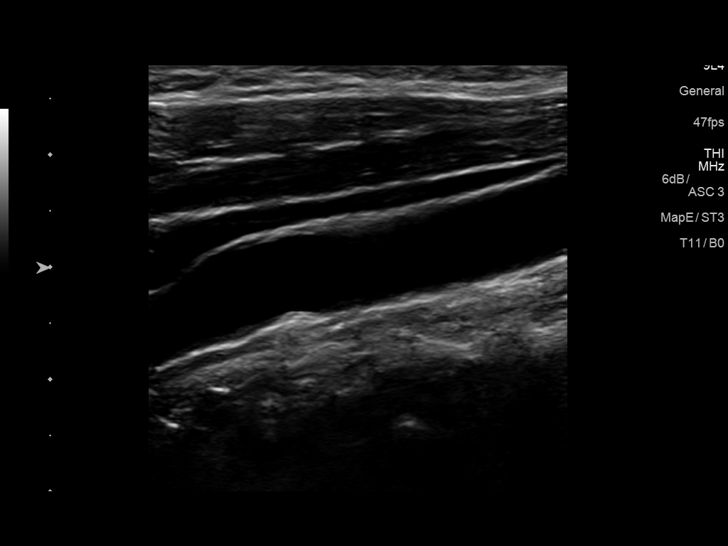
[im 25/70]
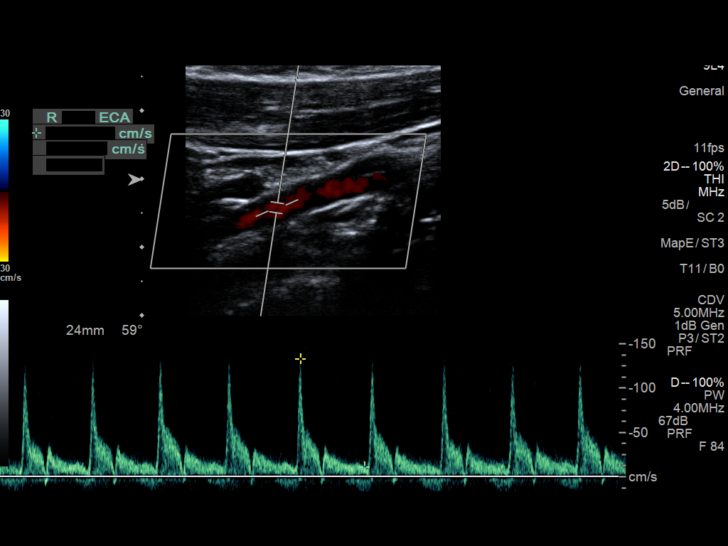
[im 31/70]
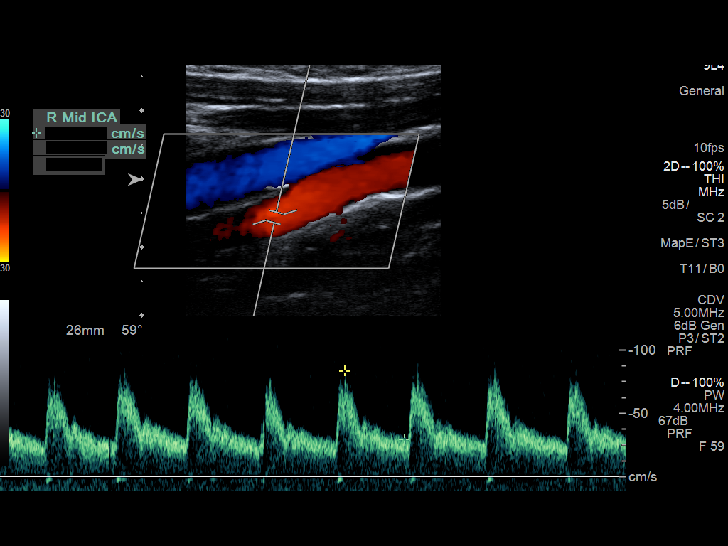
[im 37/70]
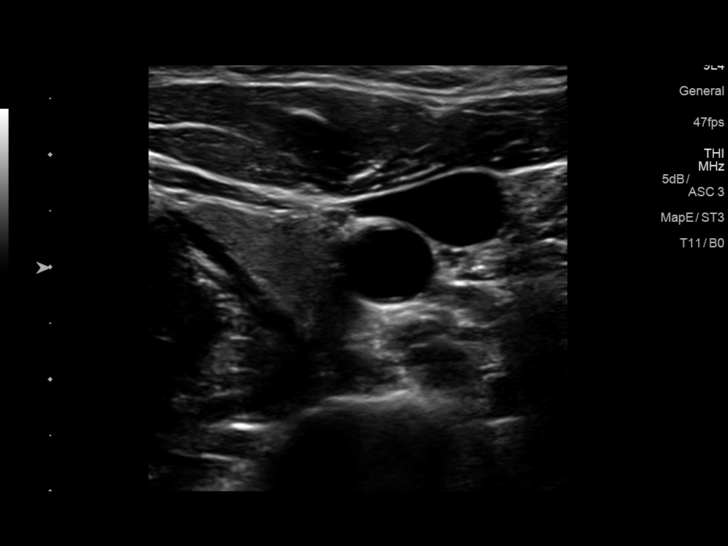
[im 40/70]
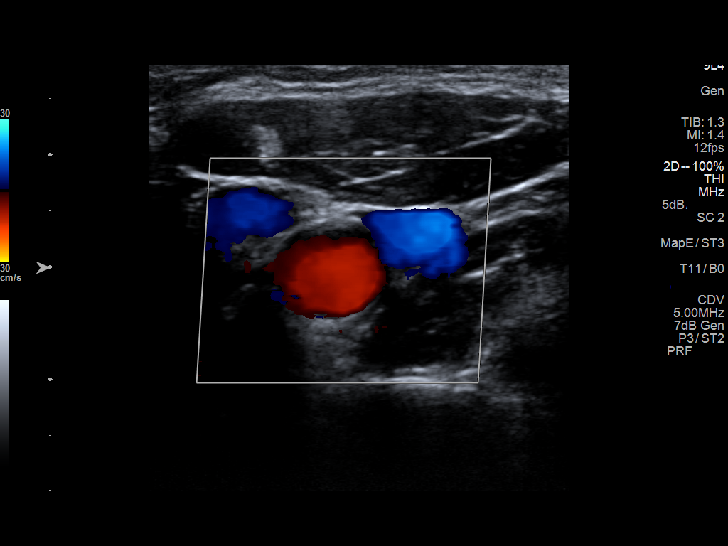
[im 46/70]
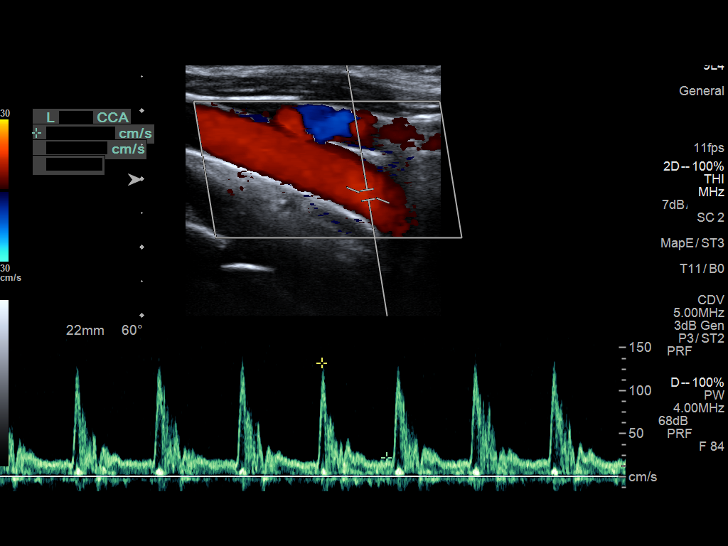
[im 52/70]
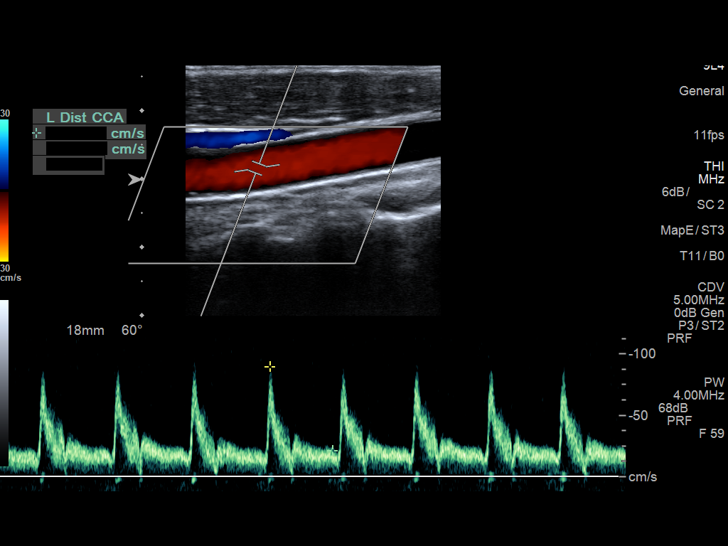
[im 58/70]
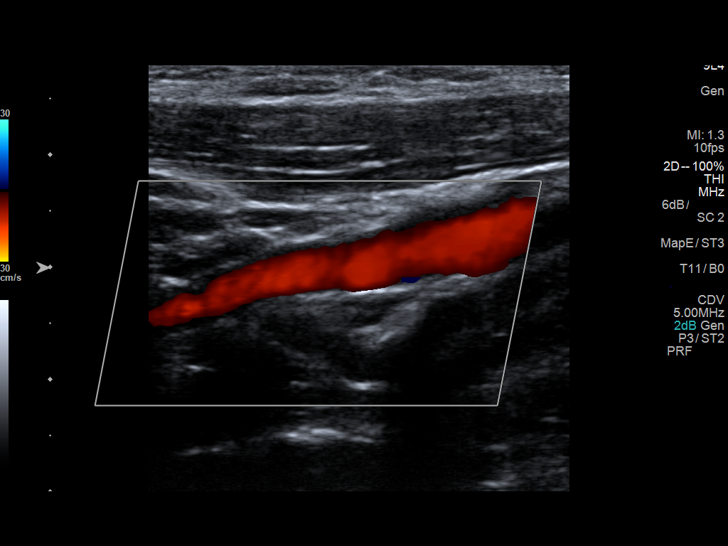
[im 64/70]
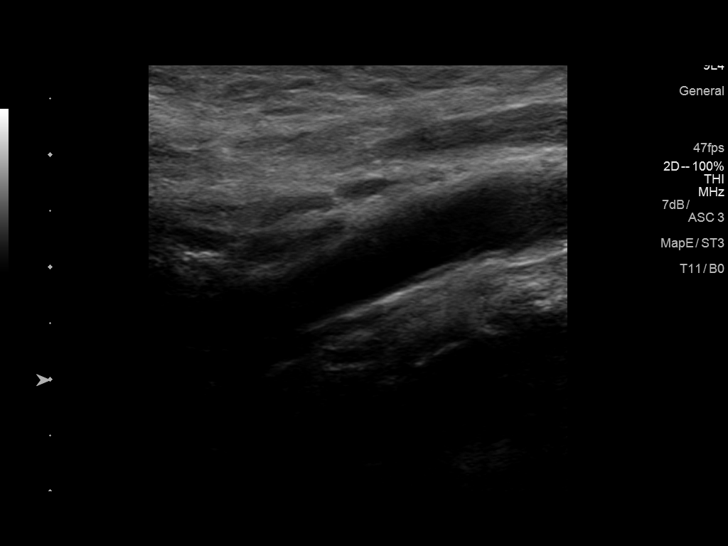
[im 70/70]
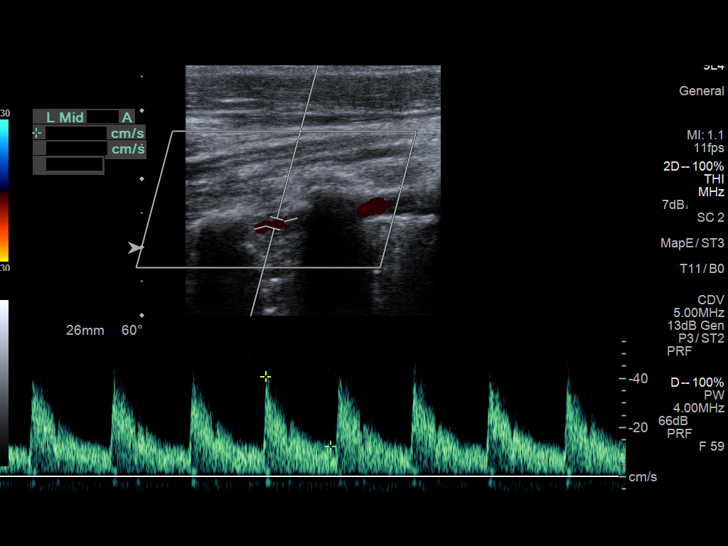

[13 of 24 positions shown; findings below may reference images not displayed]

FINDINGS: Criteria: Quantification of carotid stenosis is based on velocity
parameters that correlate the residual internal carotid diameter
with NASCET-based stenosis levels, using the diameter of the distal
internal carotid lumen as the denominator for stenosis measurement.

The following velocity measurements were obtained:

RIGHT

ICA:  92 cm/sec

CCA:  149 cm/sec

SYSTOLIC ICA/CCA RATIO:

DIASTOLIC ICA/CCA RATIO:

ECA:  133 cm/sec

LEFT

ICA:  72 cm/sec

CCA:  132 cm/sec

SYSTOLIC ICA/CCA RATIO:

DIASTOLIC ICA/CCA RATIO:

ECA:  109 cm/sec

RIGHT CAROTID ARTERY: Right carotid arteries are patent without
significant plaque or stenosis. External carotid artery is patent
with normal waveform. Normal waveforms and velocities in the
internal carotid artery.

RIGHT VERTEBRAL ARTERY: Antegrade flow and normal waveform in the
right vertebral artery.

LEFT CAROTID ARTERY: Left carotid arteries patent without
significant plaque or stenosis. External carotid artery is patent
with normal waveform. Normal waveforms and velocities in the
internal carotid artery.

LEFT VERTEBRAL ARTERY: Antegrade flow and normal waveform in the
left vertebral artery.
IMPRESSION: Normal carotid artery duplex. No significant carotid artery plaque
or stenosis.

## 2019-01-30 DIAGNOSIS — E109 Type 1 diabetes mellitus without complications: Secondary | ICD-10-CM | POA: Diagnosis not present

## 2019-01-30 DIAGNOSIS — Z794 Long term (current) use of insulin: Secondary | ICD-10-CM | POA: Diagnosis not present

## 2019-03-02 DIAGNOSIS — Z794 Long term (current) use of insulin: Secondary | ICD-10-CM | POA: Diagnosis not present

## 2019-03-02 DIAGNOSIS — E109 Type 1 diabetes mellitus without complications: Secondary | ICD-10-CM | POA: Diagnosis not present

## 2019-03-05 DIAGNOSIS — E109 Type 1 diabetes mellitus without complications: Secondary | ICD-10-CM | POA: Diagnosis not present

## 2019-03-05 DIAGNOSIS — Z794 Long term (current) use of insulin: Secondary | ICD-10-CM | POA: Diagnosis not present

## 2019-03-21 DIAGNOSIS — S0502XA Injury of conjunctiva and corneal abrasion without foreign body, left eye, initial encounter: Secondary | ICD-10-CM | POA: Diagnosis not present

## 2019-04-02 DIAGNOSIS — Z794 Long term (current) use of insulin: Secondary | ICD-10-CM | POA: Diagnosis not present

## 2019-04-02 DIAGNOSIS — E109 Type 1 diabetes mellitus without complications: Secondary | ICD-10-CM | POA: Diagnosis not present

## 2019-05-04 DIAGNOSIS — Z794 Long term (current) use of insulin: Secondary | ICD-10-CM | POA: Diagnosis not present

## 2019-05-04 DIAGNOSIS — E109 Type 1 diabetes mellitus without complications: Secondary | ICD-10-CM | POA: Diagnosis not present

## 2019-05-12 DIAGNOSIS — Z794 Long term (current) use of insulin: Secondary | ICD-10-CM | POA: Diagnosis not present

## 2019-05-12 DIAGNOSIS — E103293 Type 1 diabetes mellitus with mild nonproliferative diabetic retinopathy without macular edema, bilateral: Secondary | ICD-10-CM | POA: Diagnosis not present

## 2019-05-19 DIAGNOSIS — F321 Major depressive disorder, single episode, moderate: Secondary | ICD-10-CM | POA: Diagnosis not present

## 2019-05-19 DIAGNOSIS — G5601 Carpal tunnel syndrome, right upper limb: Secondary | ICD-10-CM | POA: Diagnosis not present

## 2019-05-29 DIAGNOSIS — Z794 Long term (current) use of insulin: Secondary | ICD-10-CM | POA: Diagnosis not present

## 2019-05-29 DIAGNOSIS — E109 Type 1 diabetes mellitus without complications: Secondary | ICD-10-CM | POA: Diagnosis not present

## 2019-06-29 DIAGNOSIS — Z794 Long term (current) use of insulin: Secondary | ICD-10-CM | POA: Diagnosis not present

## 2019-06-29 DIAGNOSIS — E109 Type 1 diabetes mellitus without complications: Secondary | ICD-10-CM | POA: Diagnosis not present

## 2019-07-14 DIAGNOSIS — Z975 Presence of (intrauterine) contraceptive device: Secondary | ICD-10-CM | POA: Diagnosis not present

## 2019-07-14 DIAGNOSIS — N6320 Unspecified lump in the left breast, unspecified quadrant: Secondary | ICD-10-CM | POA: Diagnosis not present

## 2019-07-14 DIAGNOSIS — Z124 Encounter for screening for malignant neoplasm of cervix: Secondary | ICD-10-CM | POA: Diagnosis not present

## 2019-07-14 DIAGNOSIS — Z01419 Encounter for gynecological examination (general) (routine) without abnormal findings: Secondary | ICD-10-CM | POA: Diagnosis not present

## 2019-07-30 DIAGNOSIS — E109 Type 1 diabetes mellitus without complications: Secondary | ICD-10-CM | POA: Diagnosis not present

## 2019-07-30 DIAGNOSIS — Z794 Long term (current) use of insulin: Secondary | ICD-10-CM | POA: Diagnosis not present

## 2019-08-05 DIAGNOSIS — N6320 Unspecified lump in the left breast, unspecified quadrant: Secondary | ICD-10-CM | POA: Diagnosis not present

## 2019-08-05 DIAGNOSIS — R928 Other abnormal and inconclusive findings on diagnostic imaging of breast: Secondary | ICD-10-CM | POA: Diagnosis not present

## 2019-08-05 DIAGNOSIS — R922 Inconclusive mammogram: Secondary | ICD-10-CM | POA: Diagnosis not present

## 2019-08-25 DIAGNOSIS — Z794 Long term (current) use of insulin: Secondary | ICD-10-CM | POA: Diagnosis not present

## 2019-08-25 DIAGNOSIS — E109 Type 1 diabetes mellitus without complications: Secondary | ICD-10-CM | POA: Diagnosis not present

## 2019-09-23 DIAGNOSIS — E103293 Type 1 diabetes mellitus with mild nonproliferative diabetic retinopathy without macular edema, bilateral: Secondary | ICD-10-CM | POA: Diagnosis not present

## 2019-09-23 DIAGNOSIS — H5213 Myopia, bilateral: Secondary | ICD-10-CM | POA: Diagnosis not present

## 2019-09-28 DIAGNOSIS — E109 Type 1 diabetes mellitus without complications: Secondary | ICD-10-CM | POA: Diagnosis not present

## 2019-09-28 DIAGNOSIS — Z794 Long term (current) use of insulin: Secondary | ICD-10-CM | POA: Diagnosis not present

## 2019-10-29 DIAGNOSIS — Z794 Long term (current) use of insulin: Secondary | ICD-10-CM | POA: Diagnosis not present

## 2019-10-29 DIAGNOSIS — E109 Type 1 diabetes mellitus without complications: Secondary | ICD-10-CM | POA: Diagnosis not present

## 2019-11-11 DIAGNOSIS — E103293 Type 1 diabetes mellitus with mild nonproliferative diabetic retinopathy without macular edema, bilateral: Secondary | ICD-10-CM | POA: Diagnosis not present

## 2019-11-11 DIAGNOSIS — Z794 Long term (current) use of insulin: Secondary | ICD-10-CM | POA: Diagnosis not present

## 2019-11-30 DIAGNOSIS — E109 Type 1 diabetes mellitus without complications: Secondary | ICD-10-CM | POA: Diagnosis not present

## 2019-11-30 DIAGNOSIS — Z794 Long term (current) use of insulin: Secondary | ICD-10-CM | POA: Diagnosis not present

## 2019-12-28 DIAGNOSIS — E109 Type 1 diabetes mellitus without complications: Secondary | ICD-10-CM | POA: Diagnosis not present

## 2019-12-28 DIAGNOSIS — Z794 Long term (current) use of insulin: Secondary | ICD-10-CM | POA: Diagnosis not present

## 2020-01-01 DIAGNOSIS — E103293 Type 1 diabetes mellitus with mild nonproliferative diabetic retinopathy without macular edema, bilateral: Secondary | ICD-10-CM | POA: Diagnosis not present

## 2020-01-06 DIAGNOSIS — Z30433 Encounter for removal and reinsertion of intrauterine contraceptive device: Secondary | ICD-10-CM | POA: Diagnosis not present

## 2020-01-06 DIAGNOSIS — Z01818 Encounter for other preprocedural examination: Secondary | ICD-10-CM | POA: Diagnosis not present
